# Patient Record
Sex: Male | Born: 1985 | Race: White | Hispanic: No | Marital: Single
Health system: Southern US, Community
[De-identification: ages and names within clinical notes are randomized; demographics above are authoritative.]

---

## 2017-07-12 ENCOUNTER — Emergency Department (HOSPITAL_COMMUNITY): Payer: BLUE CROSS/BLUE SHIELD

## 2017-07-12 ENCOUNTER — Emergency Department (HOSPITAL_COMMUNITY)
Admission: EM | Admit: 2017-07-12 | Discharge: 2017-07-13 | Disposition: A | Discharge status: Home / Self Care | Payer: BLUE CROSS/BLUE SHIELD | Attending: Emergency Medicine | Admitting: Emergency Medicine

## 2017-07-12 DIAGNOSIS — Y908 Blood alcohol level of 240 mg/100 ml or more: Secondary | ICD-10-CM | POA: Diagnosis not present

## 2017-07-12 DIAGNOSIS — R4182 Altered mental status, unspecified: Secondary | ICD-10-CM | POA: Diagnosis not present

## 2017-07-12 DIAGNOSIS — R569 Unspecified convulsions: Secondary | ICD-10-CM | POA: Diagnosis not present

## 2017-07-12 DIAGNOSIS — R1110 Vomiting, unspecified: Secondary | ICD-10-CM | POA: Insufficient documentation

## 2017-07-12 DIAGNOSIS — F10920 Alcohol use, unspecified with intoxication, uncomplicated: Secondary | ICD-10-CM

## 2017-07-12 DIAGNOSIS — F10120 Alcohol abuse with intoxication, uncomplicated: Secondary | ICD-10-CM | POA: Diagnosis not present

## 2017-07-12 LAB — COMPREHENSIVE METABOLIC PANEL
ALT: 17 U/L (ref 17–63)
ANION GAP: 10 (ref 5–15)
AST: 27 U/L (ref 15–41)
Albumin: 4 g/dL (ref 3.5–5.0)
Alkaline Phosphatase: 67 U/L (ref 38–126)
BUN: 12 mg/dL (ref 6–20)
CHLORIDE: 103 mmol/L (ref 101–111)
CO2: 27 mmol/L (ref 22–32)
Calcium: 8.7 mg/dL — ABNORMAL LOW (ref 8.9–10.3)
Creatinine, Ser: 0.9 mg/dL (ref 0.61–1.24)
Glucose, Bld: 98 mg/dL (ref 65–99)
Potassium: 3.6 mmol/L (ref 3.5–5.1)
SODIUM: 140 mmol/L (ref 135–145)
Total Bilirubin: 0.5 mg/dL (ref 0.3–1.2)
Total Protein: 6.8 g/dL (ref 6.5–8.1)

## 2017-07-12 LAB — CBC
HCT: 43.5 % (ref 39.0–52.0)
HEMOGLOBIN: 14.2 g/dL (ref 13.0–17.0)
MCH: 30.7 pg (ref 26.0–34.0)
MCHC: 32.6 g/dL (ref 30.0–36.0)
MCV: 94 fL (ref 78.0–100.0)
Platelets: 217 10*3/uL (ref 150–400)
RBC: 4.63 MIL/uL (ref 4.22–5.81)
RDW: 12.7 % (ref 11.5–15.5)
WBC: 9.9 10*3/uL (ref 4.0–10.5)

## 2017-07-12 LAB — ETHANOL: ALCOHOL ETHYL (B): 313 mg/dL — AB (ref <10)

## 2017-07-12 MED ORDER — SODIUM CHLORIDE 0.9 % IV BOLUS (SEPSIS)
1000.0000 mL | Freq: Once | INTRAVENOUS | Status: AC
  Administered 2017-07-12: 1000 mL via INTRAVENOUS

## 2017-07-12 NOTE — ED Triage Notes (Addendum)
Per [facility], friends of pt reported drinking and drug use during day and seizure like activity around 6:45 pm. EMS reports molly use at 0500, but pt deneis. Pt reports using ketamine at unknown time. EMS reports vomitting. EMS gave 4 of zofran. Pt was sexual and aggressive with EMS. Pt arouses with painful stimuli at times and voice at times. Pt momentarily alert to voice and oriented x4. Pt has slurred speech. Pt has 18 g in L AC and received 400 of NS from EMS. Pt arrives sweaty with vomit covering clothes and blanket.

## 2017-07-12 NOTE — ED Notes (Signed)
ED Provider at bedside. 

## 2017-07-12 NOTE — ED Notes (Signed)
Patient transported to CT 

## 2017-07-12 NOTE — ED Provider Notes (Signed)
[facility] EMERGENCY [facility] Provider Note   CSN: [id number] Arrival date & time: 07/12/17  1933     History   Chief Complaint Chief Complaint  Patient presents with  . Drug Overdose    HPI Francis DowseCarl Cloward is a 31 y.o. male who presents with seizure like activity and vomiting. Unknown PMH as patient is acutely altered. He has been drinking alcohol all day. He denies drug use. He has been vomiting. Unknown who called EMS. [place] and is in town for [employer] concert. He denies pain. He denies seizure history. Unknown what seizure activity was like because there are no friends or family at bedside. EMS gave him fluids and Zofran prior to arrival.  LEVEL 5 caveat due to intoxication  HPI  No past medical history on file.  There are no active problems to display for this patient.   The histories are not reviewed yet. Please review them in the "History" navigator section and refresh this SmartLink.   Home Medications    Prior to Admission medications   Not on File    Family History No family history on file.  Social History Social History   Tobacco Use  . Smoking status: Not on file  Substance Use Topics  . Alcohol use: Not on file  . Drug use: Not on file     Allergies   Patient has no allergy information on record.   Review of Systems Review of Systems  Unable to perform ROS: Mental status change   Physical Exam Updated Vital Signs BP 129/80 (BP Location: Right Arm)   Pulse 75   Temp 98.2 F (36.8 C) (Oral)   Resp 17   Ht 5\' 7"  (1.702 m)   Wt 77.1 kg (170 lb)   SpO2 98%   BMI 26.63 kg/m   Physical Exam  Constitutional: He is oriented to person, place, and time. He appears well-developed and well-nourished. No distress.  Intoxicated, awakes to voice and touch. Vomit on clothes  HENT:  Head: Normocephalic and atraumatic.  Eyes: Conjunctivae are normal. Pupils are equal, round, and reactive to light. Right eye  exhibits no discharge. Left eye exhibits no discharge. No scleral icterus.  Neck: Normal range of motion.  Cardiovascular: Normal rate and regular rhythm. Exam reveals no gallop and no friction rub.  No murmur heard. Pulmonary/Chest: Effort normal and breath sounds normal. No stridor. No respiratory distress. He has no wheezes. He has no rales. He exhibits no tenderness.  Abdominal: Soft. Bowel sounds are normal. He exhibits no distension. There is no tenderness.  Neurological: He is alert and oriented to person, place, and time. GCS eye subscore is 4. GCS verbal subscore is 5. GCS motor subscore is 6.  Skin: Skin is warm and dry.  Psychiatric: He has a normal mood and affect. His behavior is normal.  Nursing note and vitals reviewed.    ED Treatments / Results  Labs (all labs ordered are listed, but only abnormal results are displayed) Labs Reviewed  COMPREHENSIVE METABOLIC PANEL - Abnormal; Notable for the following components:      Result Value   Calcium 8.7 (*)    All other components within normal limits  ETHANOL - Abnormal; Notable for the following components:   Alcohol, Ethyl (B) 313 (*)    All other components within normal limits  CBC    EKG  EKG Interpretation  Date/Time:  Monday July 12 2017 19:58:38 EST Ventricular Rate:  75 PR Interval:  QRS Duration: 94 QT Interval:  386 QTC Calculation: 432 R Axis:   88 Text Interpretation:  Sinus rhythm no acute ST/T changes No old tracing to compare Confirmed by Pricilla LovelessGoldston, Scott [phone]([id number]) on 07/12/2017 9:16:23 PM       Radiology Ct Head Wo Contrast  Result Date: 07/12/2017 CLINICAL DATA:  Altered mental status.  Possible seizure. EXAM: CT HEAD WITHOUT CONTRAST TECHNIQUE: Contiguous axial images were obtained from the base of the skull through the vertex without intravenous contrast. COMPARISON:  None. FINDINGS: Brain: No evidence of acute infarction, hemorrhage, hydrocephalus, extra-axial collection or mass  lesion/mass effect. Vascular: No hyperdense vessel or unexpected calcification. Skull: Normal. Negative for fracture or focal lesion. Sinuses/Orbits: Scattered mild paranasal sinus mucosal thickening. The mastoid air cells are clear. Dysconjugate gaze. Other: None. IMPRESSION: 1.  No acute intracranial abnormality. 2. Dysconjugate gaze. Electronically Signed   By: Obie DredgeWilliam T Derry M.D.   On: 07/12/2017 20:31    Procedures Procedures (including critical care time)  Medications Ordered in ED Medications  sodium chloride 0.9 % bolus 1,000 mL (0 mLs Intravenous Stopped 07/12/17 2343)     Initial Impression / Assessment and Plan / ED Course  I have reviewed the triage vital signs and the nursing notes.  Pertinent labs & imaging results that were available during my care of the patient were reviewed by me and considered in my medical decision making (see chart for details).  618:400PM: 31 year old presents with acute alcohol intoxication and vomiting. Vitals are normal. He is sleepy but arousable and alert and oriented on exam. Labs are unremarkable other than ETOH level which is 313. CT head is negative. EKS is NSR. He was given fluids and will be monitored until he is more sober and PO challenged. No further vomiting, seizure activity, or issues during his ED stay.   1:00AM: On reassessment patient is no longer intoxicated. UDS is still pending however I do not think this will change management. He is able to ambulate to the bathroom without assistance.  He has tolerated p.o. without further vomiting.  Patient was advised cessation of alcohol or any illicit substances and to follow-up with his doctor.  He verbalized understanding.   Final Clinical Impressions(s) / ED Diagnoses   Final diagnoses:  Alcoholic intoxication without complication [facility](HCC)    ED Discharge Orders    None       Bethel BornGekas, Kimala Horne Marie, PA-C 07/13/17 1645    Pricilla LovelessGoldston, Scott, MD 07/16/17 1027

## 2019-07-11 IMAGING — CT CT HEAD W/O CM
4 series · 15 of 47 positions shown, 17 images · non-contrast
Comparison: None.

CLINICAL DATA: Altered mental status.  Possible seizure.

EXAM:
CT HEAD WITHOUT CONTRAST
TECHNIQUE: Contiguous axial images were obtained from the base of the skull
through the vertex without intravenous contrast.

[Series 3: head wo · axial · 0.45mm/px · z∈[-79,+41]mm · 7 of 33 slices shown, 9 images]
[im 5/33  brain]
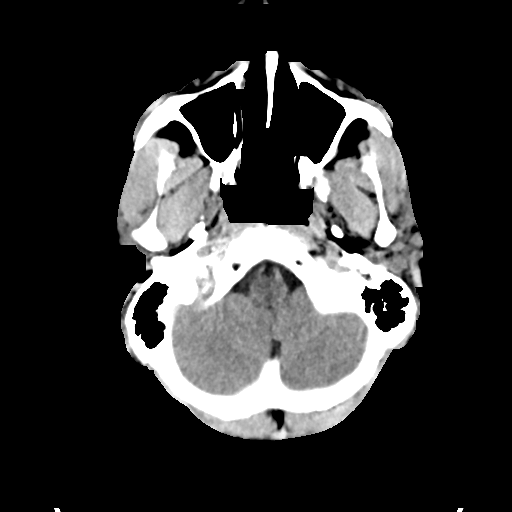
[im 5/33  bone]
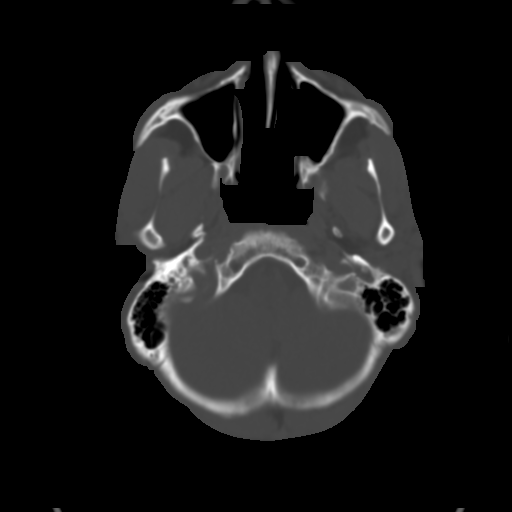
[im 9/33  brain]
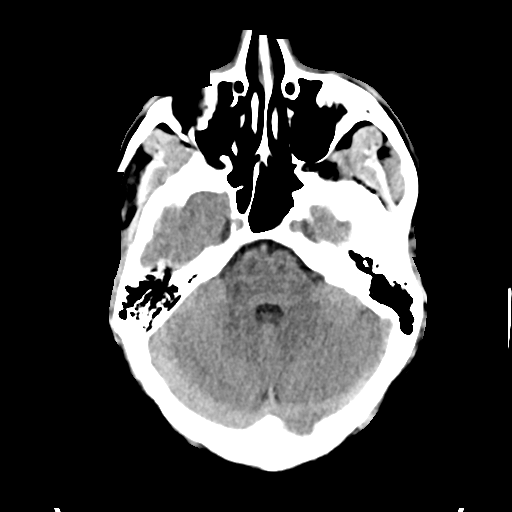
[im 13/33  brain]
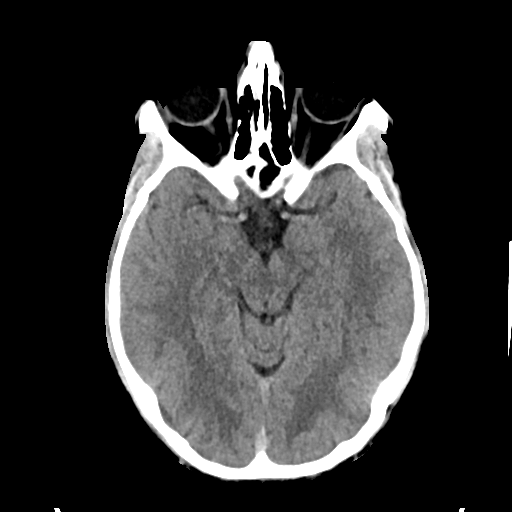
[im 17/33  brain]
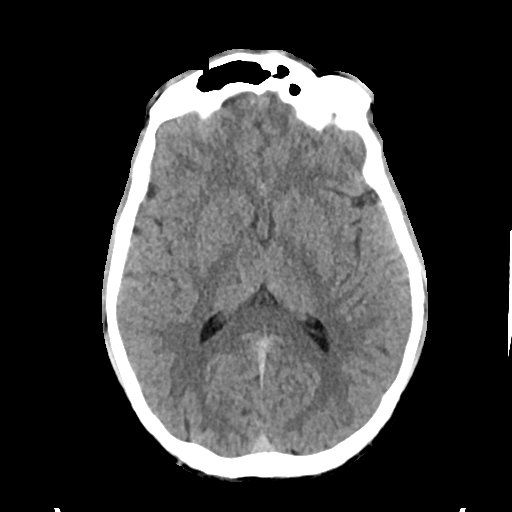
[im 21/33  brain]
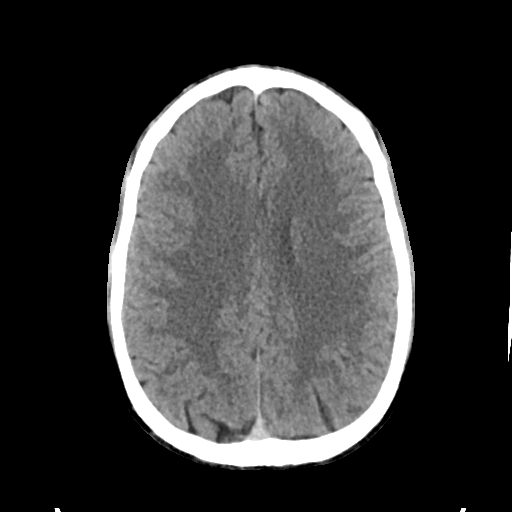
[im 21/33  bone]
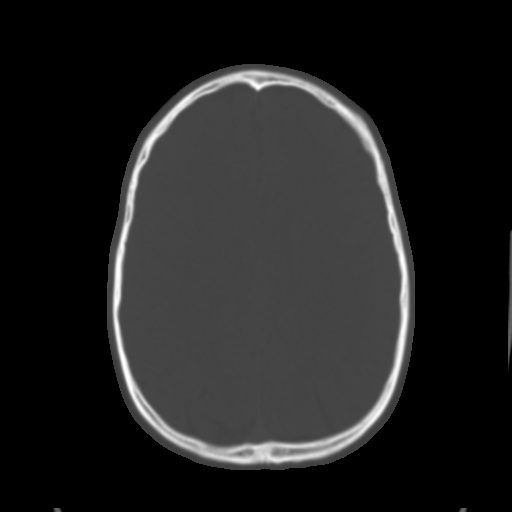
[im 25/33  brain]
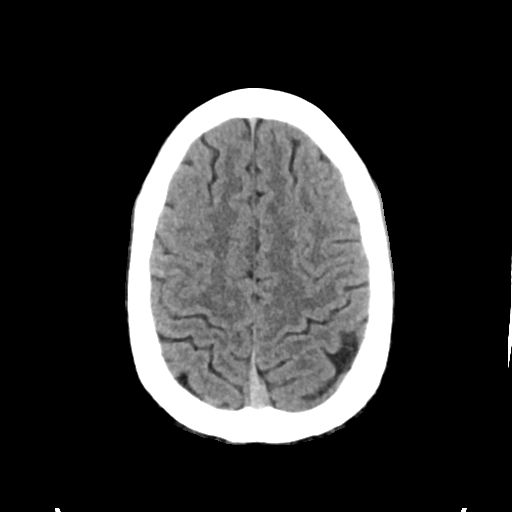
[im 29/33  brain]
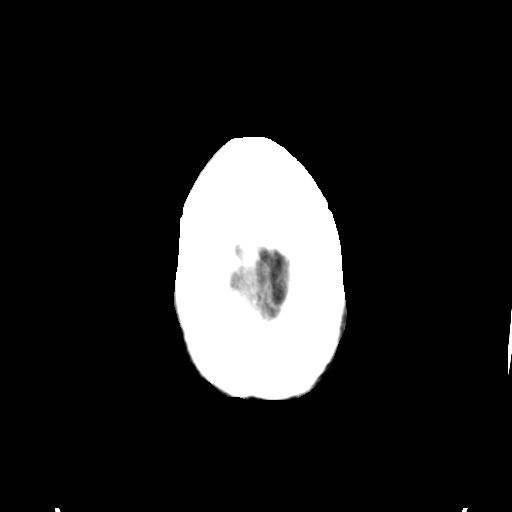

[Series 4: head bone · axial · 0.45mm/px · z∈[-83,-67]mm · 2 of 81 slices shown]
[im 9/81  bone]
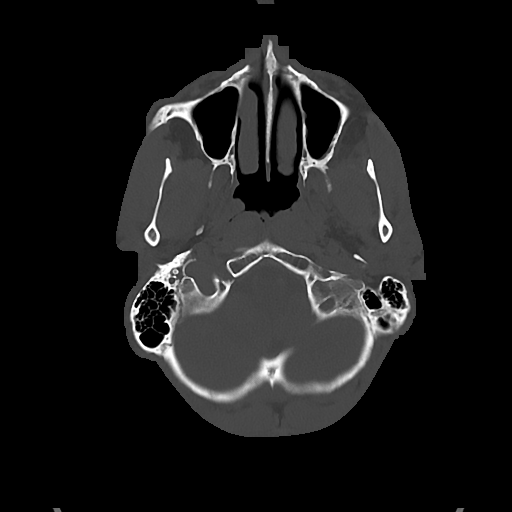
[im 17/81  bone]
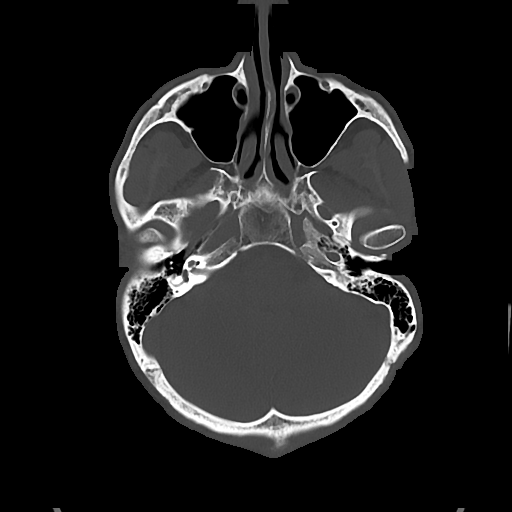

[Series 5: cor soft · coronal · 0.31mm/px · 3 of 71 slices shown]
[im 24/71  brain]
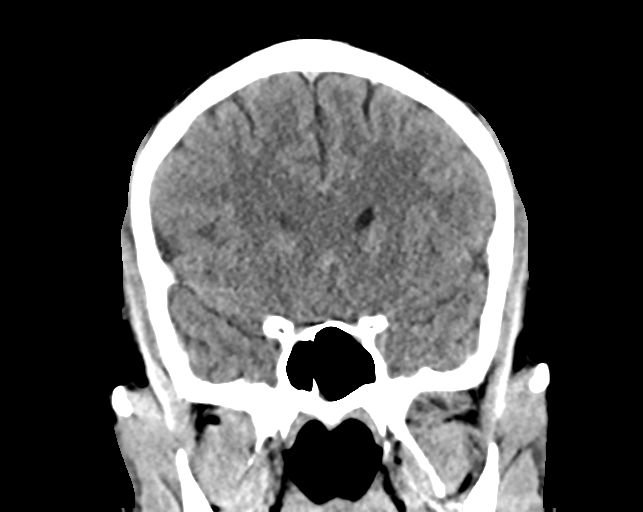
[im 32/71  brain]
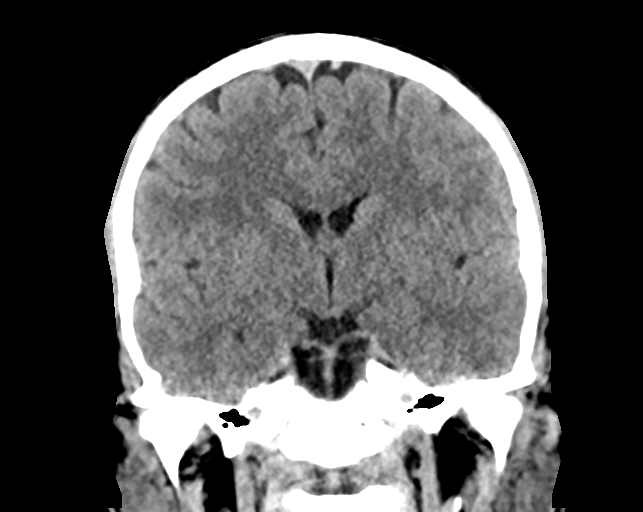
[im 39/71  brain]
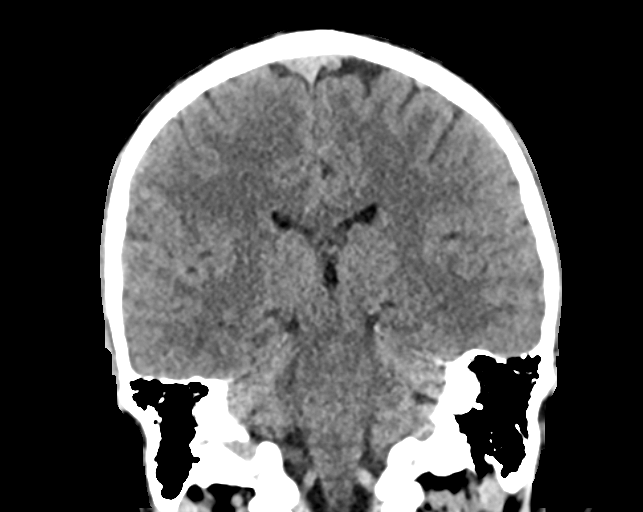

[Series 6: sag soft · sagittal · 0.31mm/px · 3 of 66 slices shown]
[im 22/66  brain]
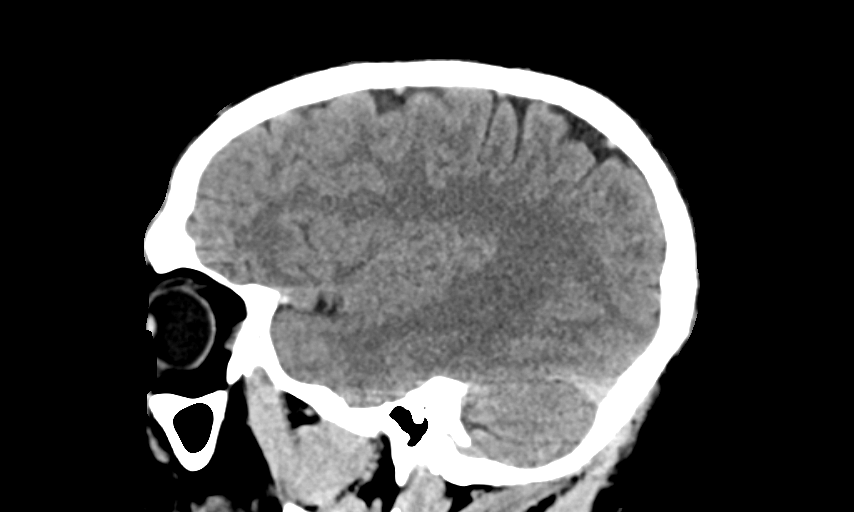
[im 33/66  brain]
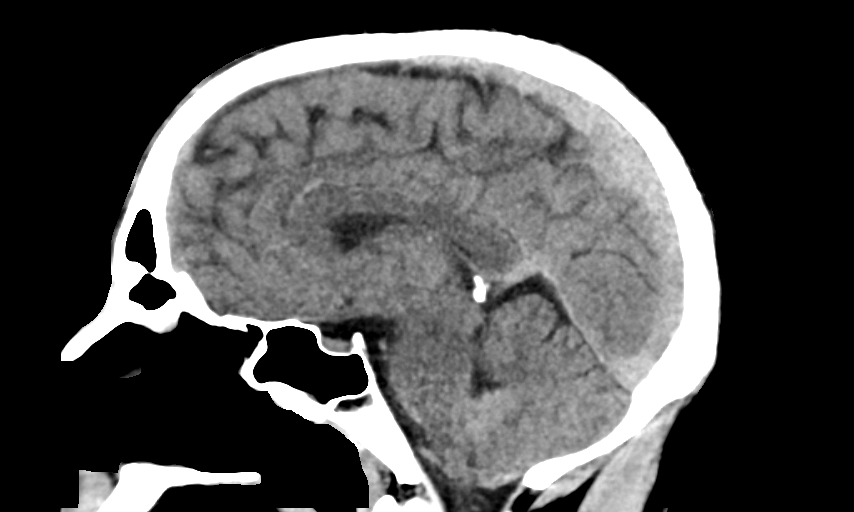
[im 44/66  brain]
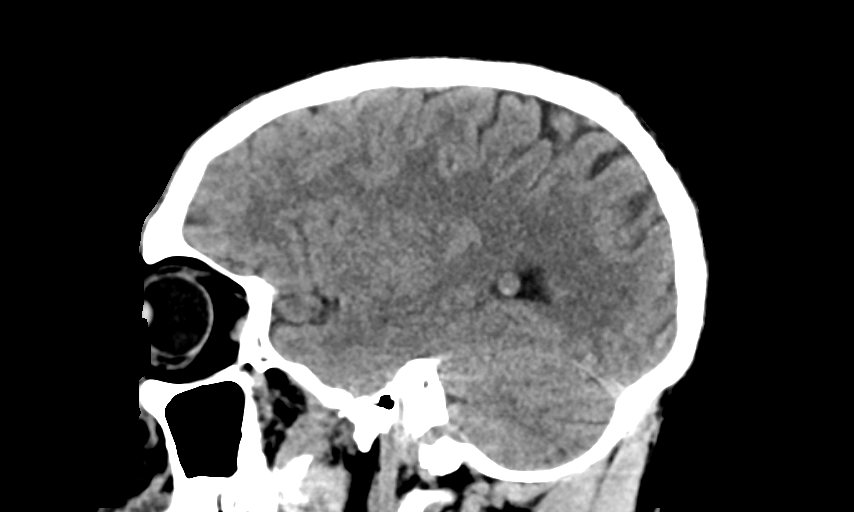

[15 of 47 positions shown; findings below may reference images not displayed]

FINDINGS: Brain: No evidence of acute infarction, hemorrhage, hydrocephalus,
extra-axial collection or mass lesion/mass effect.

Vascular: No hyperdense vessel or unexpected calcification.

Skull: Normal. Negative for fracture or focal lesion.

Sinuses/Orbits: Scattered mild paranasal sinus mucosal thickening.
The mastoid air cells are clear. Dysconjugate gaze.

Other: None.
IMPRESSION: 1.  No acute intracranial abnormality.
2. Dysconjugate gaze.
